# Patient Record
Sex: Female | Born: 1945 | Race: Black or African American | Hispanic: No | Smoking: Never smoker
Health system: Southern US, Community
[De-identification: ages and names within clinical notes are randomized; demographics above are authoritative.]

## PROBLEM LIST (undated history)

## (undated) DIAGNOSIS — I739 Peripheral vascular disease, unspecified: Secondary | ICD-10-CM

## (undated) DIAGNOSIS — C801 Malignant (primary) neoplasm, unspecified: Secondary | ICD-10-CM

## (undated) DIAGNOSIS — I10 Essential (primary) hypertension: Secondary | ICD-10-CM

## (undated) DIAGNOSIS — H409 Unspecified glaucoma: Secondary | ICD-10-CM

## (undated) DIAGNOSIS — E119 Type 2 diabetes mellitus without complications: Secondary | ICD-10-CM

## (undated) DIAGNOSIS — H548 Legal blindness, as defined in USA: Secondary | ICD-10-CM

## (undated) DIAGNOSIS — M1990 Unspecified osteoarthritis, unspecified site: Secondary | ICD-10-CM

## (undated) HISTORY — PX: EYE SURGERY: SHX253

---

## 2014-09-13 ENCOUNTER — Ambulatory Visit: Payer: Self-pay | Admitting: Surgery

## 2014-09-13 NOTE — H&P (Signed)
Kristin Hampton 09/13/2014 2:46 PM Location: [facility] Patient #: [id number] DOB: 26-Jul-1945 Widowed / Language: [name] / Race: Black or African American Female History of Present Illness [name] A. 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 She has moved here from [place] with her daughter. She speaks little english and daught helps translate. No abdominal pain constipation, bllod in her stool. At 30 cm a mass was encountered and biopsy done. Path shows adenocarcinoma. Pt daughter adamant about not telling mother it is cancer. I told the daughter this is not a good idea and she needs to talk with her mother about this.  The patient is a 69 year old female   Other Problems [name], RMA; 09/13/2014 2:47 PM) Arthritis High blood pressure  Past Surgical History [name], RMA; 09/13/2014 2:47 PM) No pertinent past surgical history  Diagnostic Studies History [name], RMA; 09/13/2014 2:47 PM) Colonoscopy within last year  Allergies [name], RMA; 09/13/2014 2:48 PM) No Known Drug Allergies 09/13/2014  Medication History [name], RMA; 09/13/2014 2:49 PM) MetFORMIN HCl (500MG  Tablet, Oral daily) Active. Tylenol Extra Strength (500MG  Tablet, Oral as needed) Active. Lisinopril (10MG  Tablet, Oral daily) Active.  Social History [name], RMA; 09/13/2014 2:47 PM) No alcohol use No caffeine use No drug use Tobacco use Never smoker.  Family History [name], [place]; 09/13/2014 2:47 PM) First Degree Relatives No pertinent family history  Pregnancy / Birth History [name], [place]; 09/13/2014 2:47 PM) Age of menopause 75-50 Gravida 7 Maternal age 92-25 Para 7     Review of Systems [name] RMA; 09/13/2014 2:47 PM) General Not Present- Appetite Loss, Chills,  Fatigue, Fever, Night Sweats, Weight Gain and Weight Loss. Skin Not Present- Change in Wart/Mole, Dryness, Hives, Jaundice, New Lesions, Non-Healing Wounds, Rash and Ulcer. HEENT Present- Visual Disturbances. Not Present- Earache, Hearing Loss, Hoarseness, Nose Bleed, Oral Ulcers, Ringing in the Ears, Seasonal Allergies, Sinus Pain, Sore Throat, Wears glasses/contact lenses and Yellow Eyes. Respiratory Not Present- Bloody sputum, Chronic Cough, Difficulty Breathing, Snoring and Wheezing. Breast Not Present- Breast Mass, Breast Pain, Nipple Discharge and Skin Changes. Cardiovascular Not Present- Chest Pain, Difficulty Breathing Lying Down, Leg Cramps, Palpitations, Rapid Heart Rate, Shortness of Breath and Swelling of Extremities. Gastrointestinal Not Present- Abdominal Pain, Bloating, Bloody Stool, Change in Bowel Habits, Chronic diarrhea, Constipation, Difficulty Swallowing, Excessive gas, Gets full quickly at meals, Hemorrhoids, Indigestion, Nausea, Rectal Pain and Vomiting. Female Genitourinary Not Present- Frequency, Nocturia, Painful Urination, Pelvic Pain and Urgency. Musculoskeletal Present- Joint Pain. Not Present- Back Pain, Joint Stiffness, Muscle Pain, Muscle Weakness and Swelling of Extremities. Neurological Not Present- Decreased Memory, Fainting, Headaches, Numbness, Seizures, Tingling, Tremor, Trouble walking and Weakness. Psychiatric Not Present- Anxiety, Bipolar, Change in Sleep Pattern, Depression, Fearful and Frequent crying. Endocrine Not Present- Cold Intolerance, Excessive Hunger, Hair Changes, Heat Intolerance, Hot flashes and New Diabetes. Hematology Not Present- Easy Bruising, Excessive bleeding, Gland problems, HIV and Persistent Infections.  Vitals ([name] RMA; 09/13/2014 2:49 PM) 09/13/2014 2:49 PM Weight: 142.8 lb Height: 62in Body Surface Area: 1.68 m Body Mass Index: 26.12 kg/m Temp.: 98.79F  Pulse: 96 (Regular)  BP: 156/80 (Sitting, Left Arm,  Standard)     Physical Exam ([name] MD; 09/13/2014 5:19 PM)  General Mental Status-Alert. General Appearance-Consistent with stated age. Hydration-Well hydrated. Voice-Normal.  Head and Neck Head-normocephalic,  atraumatic with no lesions or palpable masses. Trachea-midline. Thyroid Gland Characteristics - normal size and consistency.  Eye Note: partially blind   Chest and Lung Exam Chest and lung exam reveals -quiet, even and easy respiratory effort with no use of accessory muscles and on auscultation, normal breath sounds, no adventitious sounds and normal vocal resonance. Inspection Chest Wall - Normal. Back - normal.  Cardiovascular Cardiovascular examination reveals -normal heart sounds, regular rate and rhythm with no murmurs and normal pedal pulses bilaterally.  Abdomen Palpation/Percussion Abdominal Mass Palpable - Location - Left Lower Quadrant. Size - 3 mm (Height). Shape - Round. Consistency - Hard.  Neurologic Neurologic evaluation reveals -alert and oriented x 3 with no impairment of recent or remote memory. Mental Status-Normal.  Musculoskeletal Normal Exam - Left-Upper Extremity Strength Normal and Lower Extremity Strength Normal. Normal Exam - Right-Upper Extremity Strength Normal and Lower Extremity Strength Normal.    Assessment & Plan ([name] A. 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 Daughter does not want MD to tell her this. I told her that this is not a good way to handle this and she may require more treatment. Daughter voices understanding and will help mother understand.  Risk of partial colon resection include bleeding, infection, leak of anastamosis, death,  colostomy, organ injury, kidney injury, ureter injury, bladder injury, SBO, and need for other surgery. Pt agrees to proceed.  Current Plans Pt Education - [facility] Free Text Education/Instructions: discussed with patient and provided information.

## 2014-11-07 ENCOUNTER — Encounter (HOSPITAL_COMMUNITY)
Admission: RE | Admit: 2014-11-07 | Discharge: 2014-11-07 | Disposition: A | Discharge status: Home / Self Care | Payer: Self-pay | Source: Ambulatory Visit | Attending: Surgery | Admitting: Surgery

## 2014-11-07 ENCOUNTER — Other Ambulatory Visit (HOSPITAL_COMMUNITY): Payer: Self-pay | Admitting: *Deleted

## 2014-11-07 ENCOUNTER — Other Ambulatory Visit: Payer: Self-pay

## 2014-11-07 ENCOUNTER — Encounter (HOSPITAL_COMMUNITY): Payer: Self-pay

## 2014-11-07 DIAGNOSIS — H548 Legal blindness, as defined in USA: Secondary | ICD-10-CM | POA: Insufficient documentation

## 2014-11-07 DIAGNOSIS — Z0183 Encounter for blood typing: Secondary | ICD-10-CM | POA: Insufficient documentation

## 2014-11-07 DIAGNOSIS — R000 Tachycardia, unspecified: Secondary | ICD-10-CM | POA: Insufficient documentation

## 2014-11-07 DIAGNOSIS — Z01812 Encounter for preprocedural laboratory examination: Secondary | ICD-10-CM | POA: Insufficient documentation

## 2014-11-07 DIAGNOSIS — C189 Malignant neoplasm of colon, unspecified: Secondary | ICD-10-CM | POA: Insufficient documentation

## 2014-11-07 DIAGNOSIS — Z01818 Encounter for other preprocedural examination: Secondary | ICD-10-CM | POA: Insufficient documentation

## 2014-11-07 DIAGNOSIS — I10 Essential (primary) hypertension: Secondary | ICD-10-CM | POA: Insufficient documentation

## 2014-11-07 DIAGNOSIS — E119 Type 2 diabetes mellitus without complications: Secondary | ICD-10-CM | POA: Insufficient documentation

## 2014-11-07 HISTORY — DX: Legal blindness, as defined in USA: H54.8

## 2014-11-07 HISTORY — DX: Essential (primary) hypertension: I10

## 2014-11-07 HISTORY — DX: Type 2 diabetes mellitus without complications: E11.9

## 2014-11-07 HISTORY — DX: Unspecified glaucoma: H40.9

## 2014-11-07 HISTORY — DX: Unspecified osteoarthritis, unspecified site: M19.90

## 2014-11-07 HISTORY — DX: Peripheral vascular disease, unspecified: I73.9

## 2014-11-07 HISTORY — DX: Malignant (primary) neoplasm, unspecified: C80.1

## 2014-11-07 LAB — CBC WITH DIFFERENTIAL/PLATELET
BASOS ABS: 0 10*3/uL (ref 0.0–0.1)
Basophils Relative: 1 % (ref 0–1)
Eosinophils Absolute: 0.2 10*3/uL (ref 0.0–0.7)
Eosinophils Relative: 3 % (ref 0–5)
HCT: 33.4 % — ABNORMAL LOW (ref 36.0–46.0)
HEMOGLOBIN: 10.5 g/dL — AB (ref 12.0–15.0)
LYMPHS ABS: 2.4 10*3/uL (ref 0.7–4.0)
LYMPHS PCT: 36 % (ref 12–46)
MCH: 25.7 pg — AB (ref 26.0–34.0)
MCHC: 31.4 g/dL (ref 30.0–36.0)
MCV: 81.9 fL (ref 78.0–100.0)
Monocytes Absolute: 0.7 10*3/uL (ref 0.1–1.0)
Monocytes Relative: 11 % (ref 3–12)
NEUTROS PCT: 49 % (ref 43–77)
Neutro Abs: 3.3 10*3/uL (ref 1.7–7.7)
Platelets: 420 10*3/uL — ABNORMAL HIGH (ref 150–400)
RBC: 4.08 MIL/uL (ref 3.87–5.11)
RDW: 14.5 % (ref 11.5–15.5)
WBC: 6.6 10*3/uL (ref 4.0–10.5)

## 2014-11-07 LAB — COMPREHENSIVE METABOLIC PANEL
ALBUMIN: 3.2 g/dL — AB (ref 3.5–5.0)
ALT: 27 U/L (ref 14–54)
AST: 50 U/L — AB (ref 15–41)
Alkaline Phosphatase: 45 U/L (ref 38–126)
Anion gap: 9 (ref 5–15)
BILIRUBIN TOTAL: 0.5 mg/dL (ref 0.3–1.2)
BUN: 11 mg/dL (ref 6–20)
CHLORIDE: 107 mmol/L (ref 101–111)
CO2: 23 mmol/L (ref 22–32)
CREATININE: 0.82 mg/dL (ref 0.44–1.00)
Calcium: 9.1 mg/dL (ref 8.9–10.3)
GFR calc Af Amer: >60 mL/min (ref >60)
GLUCOSE: 145 mg/dL — AB (ref 65–99)
POTASSIUM: 4.5 mmol/L (ref 3.5–5.1)
Sodium: 139 mmol/L (ref 135–145)
Total Protein: 8.4 g/dL — ABNORMAL HIGH (ref 6.5–8.1)

## 2014-11-07 LAB — ABO/RH: ABO/RH(D): O POS

## 2014-11-07 LAB — GLUCOSE, CAPILLARY: Glucose-Capillary: 65 mg/dL (ref 65–99)

## 2014-11-07 NOTE — Progress Notes (Signed)
Pt signed Interpreter Release form allowing daughter, Lewie Chamber to interpret for her. Pt understands and speaks some [place]. Her tribal language is Igbo.   Pt's PCP is Dr. Jeanie Cooks. She denies any cardiac history, chest pain or sob.  Her CBG was 65 on arrival to PAT. Pt was given OJ and peanut butter crackers. Daughter states pt did not have lunch. Daughter states pt's fasting blood sugars are usually around 102.  Pt is legally blind.

## 2014-11-07 NOTE — Pre-Procedure Instructions (Signed)
Evona Hennington  11/07/2014      Your procedure is scheduled on Wednesday, November 16, 2014 at 8:30 AM.   Report to [facility] Entrance "A" Admitting Office at 6:30 AM.   Call this number if you have problems the morning of surgery: [phone]   Any questions prior to day of surgery, please call [phone] between 8 & 4 PM.   Remember:  Do not eat food or drink liquids after midnight Tuesday, 11/15/14.  Take these medicines the morning of surgery with A SIP OF WATER: Tylenol - if needed              Stop Aspirin and Multivitamins 5 days prior to surgery.  How to Manage Your Diabetes Before Surgery   Why is it important to control my blood sugar before and after surgery?   Improving blood sugar levels before and after surgery helps healing and can limit problems.  A way of improving blood sugar control is eating a healthy diet by:  - Eating less sugar and carbohydrates  - Increasing activity/exercise  - Talk with your doctor about reaching your blood sugar goals  High blood sugars (greater than 180 mg/dL) can raise your risk of infections and slow down your recovery so you will need to focus on controlling your diabetes during the weeks before surgery.  Make sure that the doctor who takes care of your diabetes knows about your planned surgery including the date and location.  How do I manage my blood sugars before surgery?   Check your blood sugar at least 4 times a day, 2 days before surgery to make sure that they are not too high or low.    Check your blood sugar the morning of your surgery when you wake up and every 2 hours until you get to the Short-Stay unit.   Treat a low blood sugar (less than 70 mg/dL) with 1/2 cup of clear juice (cranberry or apple), 4 glucose tablets, OR glucose gel.   Recheck blood sugar in 15 minutes after treatment (to make sure it is greater than 70 mg/dL).  If blood sugar is not greater than 70 mg/dL on re-check, call  [phone] for further instructions.     Report your blood sugar to the Short-Stay nurse when you get to Short-Stay.  References:  [employer], 2007 "How to Manage your Diabetes Before and After Surgery".  What do I do about my diabetes medications?   Do not take oral diabetes medicines (pills) the morning of surgery.   Do not wear jewelry, make-up or nail polish.  Do not wear lotions, powders, or perfumes.  You may wear deodorant.  Do not shave 48 hours prior to surgery.    Do not bring valuables to the hospital.  [facility] is not responsible for any belongings or valuables.  Contacts, dentures or bridgework may not be worn into surgery.  Leave your suitcase in the car.  After surgery it may be brought to your room.  For patients admitted to the hospital, discharge time will be determined by your treatment team.  Special instructions:  [facility] - Preparing for Surgery  Before surgery, you can play an important role.  Because skin is not sterile, your skin needs to be as free of germs as possible.  You can reduce the number of germs on you skin by washing with CHG (chlorahexidine gluconate) soap before surgery.  CHG is an antiseptic cleaner which kills germs  and bonds with the skin to continue killing germs even after washing.  Please DO NOT use if you have an allergy to CHG or antibacterial soaps.  If your skin becomes reddened/irritated stop using the CHG and inform your nurse when you arrive at Short Stay.  Do not shave (including legs and underarms) for at least 48 hours prior to the first CHG shower.  You may shave your face.  Please follow these instructions carefully:   1.  Shower with CHG Soap the night before surgery and the                                morning of Surgery.  2.  If you choose to wash your hair, wash your hair first as usual with your       normal shampoo.  3.  After you shampoo, rinse your hair and body thoroughly to  remove the                      Shampoo.  4.  Use CHG as you would any other liquid soap.  You can apply chg directly       to the skin and wash gently with scrungie or a clean washcloth.  5.  Apply the CHG Soap to your body ONLY FROM THE NECK DOWN.        Do not use on open wounds or open sores.  Avoid contact with your eyes, ears, mouth and genitals (private parts).  Wash genitals (private parts) with your normal soap.  6.  Wash thoroughly, paying special attention to the area where your surgery        will be performed.  7.  Thoroughly rinse your body with warm water from the neck down.  8.  DO NOT shower/wash with your normal soap after using and rinsing off       the CHG Soap.  9.  Pat yourself dry with a clean towel.            10.  Wear clean pajamas.            11.  Place clean sheets on your bed the night of your first shower and do not        sleep with pets.  Day of Surgery  Do not apply any lotions the morning of surgery.  Please wear clean clothes to the hospital.    Please read over the following fact sheets that you were given. Pain Booklet, Coughing and Deep Breathing, Blood Transfusion Information and Surgical Site Infection Prevention

## 2014-11-08 LAB — HEMOGLOBIN A1C
HEMOGLOBIN A1C: 6 % — AB (ref 4.8–5.6)
Mean Plasma Glucose: 126 mg/dL

## 2014-11-08 LAB — CEA: CEA: 3.6 ng/mL (ref 0.0–4.7)

## 2014-11-15 MED ORDER — DEXTROSE 5 % IV SOLN
2.0000 g | INTRAVENOUS | Status: AC
  Administered 2014-11-16: 2 g via INTRAVENOUS
  Filled 2014-11-15: qty 2

## 2014-11-15 MED ORDER — CHLORHEXIDINE GLUCONATE CLOTH 2 % EX PADS: 6.0000 | MEDICATED_PAD | Freq: Once | CUTANEOUS | Status: DC

## 2014-11-16 ENCOUNTER — Inpatient Hospital Stay (HOSPITAL_COMMUNITY): Payer: Self-pay | Admitting: Certified Registered Nurse Anesthetist

## 2014-11-16 ENCOUNTER — Encounter (HOSPITAL_COMMUNITY)
Admission: RE | Disposition: A | Discharge status: Home / Self Care | Payer: Self-pay | Source: Ambulatory Visit | Attending: Surgery

## 2014-11-16 ENCOUNTER — Encounter (HOSPITAL_COMMUNITY): Payer: Self-pay | Admitting: *Deleted

## 2014-11-16 ENCOUNTER — Inpatient Hospital Stay (HOSPITAL_COMMUNITY)
Admission: RE | Admit: 2014-11-16 | Discharge: 2014-11-21 | DRG: 330 | Disposition: A | Discharge status: Home / Self Care | Payer: Self-pay | Source: Ambulatory Visit | Attending: Surgery | Admitting: Surgery

## 2014-11-16 DIAGNOSIS — M1990 Unspecified osteoarthritis, unspecified site: Secondary | ICD-10-CM | POA: Diagnosis present

## 2014-11-16 DIAGNOSIS — D649 Anemia, unspecified: Secondary | ICD-10-CM | POA: Diagnosis present

## 2014-11-16 DIAGNOSIS — R509 Fever, unspecified: Secondary | ICD-10-CM

## 2014-11-16 DIAGNOSIS — R030 Elevated blood-pressure reading, without diagnosis of hypertension: Secondary | ICD-10-CM | POA: Diagnosis present

## 2014-11-16 DIAGNOSIS — C186 Malignant neoplasm of descending colon: Principal | ICD-10-CM | POA: Diagnosis present

## 2014-11-16 DIAGNOSIS — Z23 Encounter for immunization: Secondary | ICD-10-CM

## 2014-11-16 DIAGNOSIS — C187 Malignant neoplasm of sigmoid colon: Secondary | ICD-10-CM | POA: Diagnosis present

## 2014-11-16 DIAGNOSIS — C189 Malignant neoplasm of colon, unspecified: Secondary | ICD-10-CM | POA: Diagnosis present

## 2014-11-16 HISTORY — PX: COLON RESECTION: SHX5231

## 2014-11-16 LAB — CBC
HCT: 27.4 % — ABNORMAL LOW (ref 36.0–46.0)
HEMOGLOBIN: 8.7 g/dL — AB (ref 12.0–15.0)
MCH: 25.6 pg — ABNORMAL LOW (ref 26.0–34.0)
MCHC: 31.8 g/dL (ref 30.0–36.0)
MCV: 80.6 fL (ref 78.0–100.0)
PLATELETS: 429 10*3/uL — AB (ref 150–400)
RBC: 3.4 MIL/uL — AB (ref 3.87–5.11)
RDW: 14.3 % (ref 11.5–15.5)
WBC: 10.4 10*3/uL (ref 4.0–10.5)

## 2014-11-16 LAB — GLUCOSE, CAPILLARY
Glucose-Capillary: 111 mg/dL — ABNORMAL HIGH (ref 65–99)
Glucose-Capillary: 185 mg/dL — ABNORMAL HIGH (ref 65–99)

## 2014-11-16 LAB — CREATININE, SERUM
CREATININE: 0.85 mg/dL (ref 0.44–1.00)
GFR calc Af Amer: >60 mL/min (ref >60)

## 2014-11-16 SURGERY — COLON RESECTION LAPAROSCOPIC
Anesthesia: General | Site: Abdomen

## 2014-11-16 MED ORDER — FENTANYL CITRATE (PF) 250 MCG/5ML IJ SOLN
INTRAMUSCULAR | Status: AC
  Filled 2014-11-16: qty 5

## 2014-11-16 MED ORDER — FENTANYL CITRATE (PF) 100 MCG/2ML IJ SOLN
INTRAMUSCULAR | Status: DC | PRN
  Administered 2014-11-16 (×2): 50 ug via INTRAVENOUS
  Administered 2014-11-16 (×2): 100 ug via INTRAVENOUS
  Administered 2014-11-16: 50 ug via INTRAVENOUS

## 2014-11-16 MED ORDER — ESMOLOL HCL 10 MG/ML IV SOLN
INTRAVENOUS | Status: DC | PRN
  Administered 2014-11-16 (×3): 20 mg via INTRAVENOUS

## 2014-11-16 MED ORDER — MEPERIDINE HCL 25 MG/ML IJ SOLN: 6.2500 mg | INTRAMUSCULAR | Status: DC | PRN

## 2014-11-16 MED ORDER — ALVIMOPAN 12 MG PO CAPS
12.0000 mg | ORAL_CAPSULE | Freq: Once | ORAL | Status: AC
  Administered 2014-11-16: 12 mg via ORAL

## 2014-11-16 MED ORDER — DIPHENHYDRAMINE HCL 12.5 MG/5ML PO ELIX: 12.5000 mg | ORAL_SOLUTION | Freq: Four times a day (QID) | ORAL | Status: DC | PRN

## 2014-11-16 MED ORDER — LIDOCAINE HCL (CARDIAC) 20 MG/ML IV SOLN
INTRAVENOUS | Status: AC
  Filled 2014-11-16: qty 5

## 2014-11-16 MED ORDER — HYDROMORPHONE HCL 1 MG/ML IJ SOLN
1.0000 mg | INTRAMUSCULAR | Status: DC | PRN
  Administered 2014-11-17 – 2014-11-19 (×5): 1 mg via INTRAVENOUS
  Filled 2014-11-16 (×6): qty 1

## 2014-11-16 MED ORDER — GLYCOPYRROLATE 0.2 MG/ML IJ SOLN
INTRAMUSCULAR | Status: DC | PRN
  Administered 2014-11-16: .4 mg via INTRAVENOUS

## 2014-11-16 MED ORDER — PROMETHAZINE HCL 25 MG/ML IJ SOLN: 6.2500 mg | INTRAMUSCULAR | Status: DC | PRN

## 2014-11-16 MED ORDER — DEXTROSE 5 % IV SOLN
2.0000 g | Freq: Two times a day (BID) | INTRAVENOUS | Status: AC
  Administered 2014-11-16: 2 g via INTRAVENOUS
  Filled 2014-11-16: qty 2

## 2014-11-16 MED ORDER — LACTATED RINGERS IV SOLN
INTRAVENOUS | Status: DC | PRN
  Administered 2014-11-16 (×2): via INTRAVENOUS

## 2014-11-16 MED ORDER — LISINOPRIL 10 MG PO TABS
10.0000 mg | ORAL_TABLET | Freq: Every day | ORAL | Status: DC
  Administered 2014-11-17 – 2014-11-20 (×4): 10 mg via ORAL
  Filled 2014-11-16 (×4): qty 1

## 2014-11-16 MED ORDER — 0.9 % SODIUM CHLORIDE (POUR BTL) OPTIME
TOPICAL | Status: DC | PRN
  Administered 2014-11-16 (×4): 1000 mL

## 2014-11-16 MED ORDER — ROCURONIUM BROMIDE 50 MG/5ML IV SOLN
INTRAVENOUS | Status: AC
  Filled 2014-11-16: qty 1

## 2014-11-16 MED ORDER — PROPOFOL 10 MG/ML IV BOLUS
INTRAVENOUS | Status: AC
  Filled 2014-11-16: qty 20

## 2014-11-16 MED ORDER — OXYCODONE-ACETAMINOPHEN 5-325 MG PO TABS: 1.0000 | ORAL_TABLET | ORAL | Status: DC | PRN

## 2014-11-16 MED ORDER — HEMOSTATIC AGENTS (NO CHARGE) OPTIME
TOPICAL | Status: DC | PRN
  Administered 2014-11-16: 1 via TOPICAL

## 2014-11-16 MED ORDER — ACETAMINOPHEN 325 MG PO TABS: 650.0000 mg | ORAL_TABLET | Freq: Four times a day (QID) | ORAL | Status: DC | PRN

## 2014-11-16 MED ORDER — DIPHENHYDRAMINE HCL 50 MG/ML IJ SOLN: 12.5000 mg | Freq: Four times a day (QID) | INTRAMUSCULAR | Status: DC | PRN

## 2014-11-16 MED ORDER — PNEUMOCOCCAL VAC POLYVALENT 25 MCG/0.5ML IJ INJ
0.5000 mL | INJECTION | INTRAMUSCULAR | Status: AC
  Administered 2014-11-17: 0.5 mL via INTRAMUSCULAR
  Filled 2014-11-16: qty 0.5

## 2014-11-16 MED ORDER — ONDANSETRON HCL 4 MG/2ML IJ SOLN
INTRAMUSCULAR | Status: DC | PRN
  Administered 2014-11-16: 4 mg via INTRAVENOUS

## 2014-11-16 MED ORDER — KCL IN DEXTROSE-NACL 20-5-0.9 MEQ/L-%-% IV SOLN
INTRAVENOUS | Status: DC
  Administered 2014-11-16 – 2014-11-20 (×5): via INTRAVENOUS
  Filled 2014-11-16 (×11): qty 1000

## 2014-11-16 MED ORDER — BUPIVACAINE-EPINEPHRINE 0.25% -1:200000 IJ SOLN
INTRAMUSCULAR | Status: DC | PRN
  Administered 2014-11-16: 30 mL

## 2014-11-16 MED ORDER — ONDANSETRON HCL 4 MG PO TABS
4.0000 mg | ORAL_TABLET | Freq: Four times a day (QID) | ORAL | Status: DC | PRN
Start: 2014-11-16 — End: 2014-11-21

## 2014-11-16 MED ORDER — ALVIMOPAN 12 MG PO CAPS
12.0000 mg | ORAL_CAPSULE | Freq: Two times a day (BID) | ORAL | Status: DC
  Administered 2014-11-17 – 2014-11-18 (×3): 12 mg via ORAL
  Filled 2014-11-16 (×3): qty 1

## 2014-11-16 MED ORDER — INFLUENZA VAC SPLIT QUAD 0.5 ML IM SUSY
0.5000 mL | PREFILLED_SYRINGE | INTRAMUSCULAR | Status: AC
  Administered 2014-11-17: 0.5 mL via INTRAMUSCULAR
  Filled 2014-11-16: qty 0.5

## 2014-11-16 MED ORDER — PEG 3350-KCL-NA BICARB-NACL 420 G PO SOLR: 4000.0000 mL | Freq: Once | ORAL | Status: DC

## 2014-11-16 MED ORDER — MIDAZOLAM HCL 2 MG/2ML IJ SOLN
INTRAMUSCULAR | Status: AC
  Filled 2014-11-16: qty 4

## 2014-11-16 MED ORDER — HYDROMORPHONE HCL 1 MG/ML IJ SOLN: 0.2500 mg | INTRAMUSCULAR | Status: DC | PRN

## 2014-11-16 MED ORDER — PHENYLEPHRINE 40 MCG/ML (10ML) SYRINGE FOR IV PUSH (FOR BLOOD PRESSURE SUPPORT)
PREFILLED_SYRINGE | INTRAVENOUS | Status: AC
  Filled 2014-11-16: qty 10

## 2014-11-16 MED ORDER — SODIUM CHLORIDE 0.9 % IR SOLN
Status: DC | PRN
  Administered 2014-11-16: 1000 mL

## 2014-11-16 MED ORDER — BUPIVACAINE-EPINEPHRINE (PF) 0.25% -1:200000 IJ SOLN
INTRAMUSCULAR | Status: AC
  Filled 2014-11-16: qty 30

## 2014-11-16 MED ORDER — NEOMYCIN SULFATE 500 MG PO TABS: 1000.0000 mg | ORAL_TABLET | ORAL | Status: DC

## 2014-11-16 MED ORDER — PROPOFOL 10 MG/ML IV BOLUS
INTRAVENOUS | Status: DC | PRN
  Administered 2014-11-16: 150 mg via INTRAVENOUS

## 2014-11-16 MED ORDER — ALUM & MAG HYDROXIDE-SIMETH 200-200-20 MG/5ML PO SUSP: 30.0000 mL | Freq: Four times a day (QID) | ORAL | Status: DC | PRN

## 2014-11-16 MED ORDER — ERYTHROMYCIN BASE 250 MG PO TABS: 1000.0000 mg | ORAL_TABLET | ORAL | Status: DC

## 2014-11-16 MED ORDER — VECURONIUM BROMIDE 10 MG IV SOLR
INTRAVENOUS | Status: DC | PRN
  Administered 2014-11-16 (×2): 2 mg via INTRAVENOUS

## 2014-11-16 MED ORDER — ALVIMOPAN 12 MG PO CAPS
ORAL_CAPSULE | ORAL | Status: AC
  Administered 2014-11-16: 12 mg via ORAL
  Filled 2014-11-16: qty 1

## 2014-11-16 MED ORDER — NEOSTIGMINE METHYLSULFATE 10 MG/10ML IV SOLN
INTRAVENOUS | Status: DC | PRN
  Administered 2014-11-16: 3 mg via INTRAVENOUS

## 2014-11-16 MED ORDER — ZOLPIDEM TARTRATE 5 MG PO TABS: 5.0000 mg | ORAL_TABLET | Freq: Every evening | ORAL | Status: DC | PRN

## 2014-11-16 MED ORDER — SACCHAROMYCES BOULARDII 250 MG PO CAPS
250.0000 mg | ORAL_CAPSULE | Freq: Two times a day (BID) | ORAL | Status: DC
  Administered 2014-11-16 – 2014-11-20 (×8): 250 mg via ORAL
  Filled 2014-11-16 (×8): qty 1

## 2014-11-16 MED ORDER — PHENYLEPHRINE HCL 10 MG/ML IJ SOLN
INTRAMUSCULAR | Status: DC | PRN
  Administered 2014-11-16 (×5): 80 ug via INTRAVENOUS

## 2014-11-16 MED ORDER — ONDANSETRON HCL 4 MG/2ML IJ SOLN
INTRAMUSCULAR | Status: AC
  Filled 2014-11-16: qty 2

## 2014-11-16 MED ORDER — MIDAZOLAM HCL 5 MG/5ML IJ SOLN
INTRAMUSCULAR | Status: DC | PRN
  Administered 2014-11-16 (×2): 1 mg via INTRAVENOUS

## 2014-11-16 MED ORDER — LIDOCAINE HCL (CARDIAC) 20 MG/ML IV SOLN
INTRAVENOUS | Status: DC | PRN
  Administered 2014-11-16: 60 mg via INTRAVENOUS

## 2014-11-16 MED ORDER — ENOXAPARIN SODIUM 40 MG/0.4ML ~~LOC~~ SOLN
40.0000 mg | SUBCUTANEOUS | Status: DC
  Administered 2014-11-17: 40 mg via SUBCUTANEOUS
  Filled 2014-11-16: qty 0.4

## 2014-11-16 MED ORDER — ONDANSETRON HCL 4 MG/2ML IJ SOLN: 4.0000 mg | Freq: Four times a day (QID) | INTRAMUSCULAR | Status: DC | PRN

## 2014-11-16 SURGICAL SUPPLY — 74 items
APPLIER CLIP 5 13 M/L LIGAMAX5 (MISCELLANEOUS)
BLADE SURG 10 STRL SS (BLADE) ×4 IMPLANT
CANISTER SUCTION 2500CC (MISCELLANEOUS) ×4 IMPLANT
CELLS DAT CNTRL 66122 CELL SVR (MISCELLANEOUS) IMPLANT
CLIP APPLIE 5 13 M/L LIGAMAX5 (MISCELLANEOUS) IMPLANT
COVER MAYO STAND STRL (DRAPES) ×8 IMPLANT
DRAPE PROXIMA HALF (DRAPES) ×4 IMPLANT
DRAPE UTILITY XL STRL (DRAPES) ×4 IMPLANT
DRAPE WARM FLUID 44X44 (DRAPE) ×4 IMPLANT
DRSG OPSITE POSTOP 4X10 (GAUZE/BANDAGES/DRESSINGS) IMPLANT
DRSG OPSITE POSTOP 4X6 (GAUZE/BANDAGES/DRESSINGS) ×4 IMPLANT
DRSG OPSITE POSTOP 4X8 (GAUZE/BANDAGES/DRESSINGS) IMPLANT
DRSG TEGADERM 2-3/8X2-3/4 SM (GAUZE/BANDAGES/DRESSINGS) ×16 IMPLANT
ELECT BLADE 6.5 EXT (BLADE) ×4 IMPLANT
ELECT CAUTERY BLADE 6.4 (BLADE) ×12 IMPLANT
ELECT REM PT RETURN 9FT ADLT (ELECTROSURGICAL) ×4
ELECTRODE REM PT RTRN 9FT ADLT (ELECTROSURGICAL) ×2 IMPLANT
GAUZE SPONGE 2X2 8PLY STRL LF (GAUZE/BANDAGES/DRESSINGS) ×2 IMPLANT
GEL ULTRASOUND 20GR AQUASONIC (MISCELLANEOUS) ×4 IMPLANT
GLOVE BIO SURGEON STRL SZ 6.5 (GLOVE) ×3 IMPLANT
GLOVE BIO SURGEON STRL SZ7 (GLOVE) ×8 IMPLANT
GLOVE BIO SURGEON STRL SZ7.5 (GLOVE) ×8 IMPLANT
GLOVE BIO SURGEON STRL SZ8 (GLOVE) ×8 IMPLANT
GLOVE BIO SURGEONS STRL SZ 6.5 (GLOVE) ×1
GLOVE BIOGEL PI IND STRL 7.0 (GLOVE) ×12 IMPLANT
GLOVE BIOGEL PI IND STRL 8 (GLOVE) ×8 IMPLANT
GLOVE BIOGEL PI INDICATOR 7.0 (GLOVE) ×12
GLOVE BIOGEL PI INDICATOR 8 (GLOVE) ×8
GLOVE ECLIPSE 6.5 STRL STRAW (GLOVE) ×8 IMPLANT
GLOVE SURG SS PI 7.0 STRL IVOR (GLOVE) ×4 IMPLANT
GOWN STRL REUS W/ TWL LRG LVL3 (GOWN DISPOSABLE) ×12 IMPLANT
GOWN STRL REUS W/ TWL XL LVL3 (GOWN DISPOSABLE) ×4 IMPLANT
GOWN STRL REUS W/TWL LRG LVL3 (GOWN DISPOSABLE) ×12
GOWN STRL REUS W/TWL XL LVL3 (GOWN DISPOSABLE) ×4
HEMOSTAT SNOW SURGICEL 2X4 (HEMOSTASIS) ×4 IMPLANT
LEGGING LITHOTOMY PAIR STRL (DRAPES) ×4 IMPLANT
LIGASURE IMPACT 36 18CM CVD LR (INSTRUMENTS) ×4 IMPLANT
NS IRRIG 1000ML POUR BTL (IV SOLUTION) ×16 IMPLANT
PENCIL BUTTON HOLSTER BLD 10FT (ELECTRODE) ×12 IMPLANT
RELOAD PROXIMATE 75MM BLUE (ENDOMECHANICALS) ×8 IMPLANT
RTRCTR WOUND ALEXIS 18CM MED (MISCELLANEOUS)
SCALPEL HARMONIC ACE (MISCELLANEOUS) ×4 IMPLANT
SCISSORS LAP 5X35 DISP (ENDOMECHANICALS) ×4 IMPLANT
SET IRRIG TUBING LAPAROSCOPIC (IRRIGATION / IRRIGATOR) IMPLANT
SLEEVE ENDOPATH XCEL 5M (ENDOMECHANICALS) ×12 IMPLANT
SPONGE GAUZE 2X2 STER 10/PKG (GAUZE/BANDAGES/DRESSINGS) ×2
SPONGE LAP 18X18 X RAY DECT (DISPOSABLE) ×4 IMPLANT
STAPLER GUN LINEAR PROX 60 (STAPLE) ×4 IMPLANT
STAPLER PROXIMATE 75MM BLUE (STAPLE) ×4 IMPLANT
STAPLER VISISTAT 35W (STAPLE) ×4 IMPLANT
SUCTION POOLE TIP (SUCTIONS) ×4 IMPLANT
SUT PDS AB 1 CTX 36 (SUTURE) ×8 IMPLANT
SUT PROLENE 2 0 SH DA (SUTURE) IMPLANT
SUT VIC AB 2-0 SH 18 (SUTURE) ×4 IMPLANT
SUT VIC AB 3-0 SH 18 (SUTURE) ×4 IMPLANT
SUT VICRYL AB 2 0 TIES (SUTURE) ×4 IMPLANT
SUT VICRYL AB 3 0 TIES (SUTURE) ×4 IMPLANT
SYR BULB IRRIGATION 50ML (SYRINGE) ×4 IMPLANT
SYS LAPSCP GELPORT 120MM (MISCELLANEOUS) ×4
SYSTEM LAPSCP GELPORT 120MM (MISCELLANEOUS) ×2 IMPLANT
TOWEL OR 17X26 10 PK STRL BLUE (TOWEL DISPOSABLE) ×8 IMPLANT
TRAY FOLEY CATH 16FRSI W/METER (SET/KITS/TRAYS/PACK) ×4 IMPLANT
TRAY LAPAROSCOPIC MC (CUSTOM PROCEDURE TRAY) ×4 IMPLANT
TRAY PROCTOSCOPIC FIBER OPTIC (SET/KITS/TRAYS/PACK) ×4 IMPLANT
TROCAR XCEL 12X100 BLDLESS (ENDOMECHANICALS) IMPLANT
TROCAR XCEL BLUNT TIP 100MML (ENDOMECHANICALS) IMPLANT
TROCAR XCEL NON-BLD 11X100MML (ENDOMECHANICALS) IMPLANT
TROCAR XCEL NON-BLD 5MMX100MML (ENDOMECHANICALS) ×4 IMPLANT
TUBE CONNECTING 12'X1/4 (SUCTIONS) ×2
TUBE CONNECTING 12X1/4 (SUCTIONS) ×6 IMPLANT
TUBING FILTER THERMOFLATOR (ELECTROSURGICAL) ×4 IMPLANT
TUBING INSUFFLATION (TUBING) ×4 IMPLANT
UNDERPAD 30X30 INCONTINENT (UNDERPADS AND DIAPERS) ×4 IMPLANT
YANKAUER SUCT BULB TIP NO VENT (SUCTIONS) ×8 IMPLANT

## 2014-11-16 NOTE — H&P (Signed)
H&P   Kristin Hampton (MR# [id number])      H&P Info    Author Note Status Last Update User Last Update Date/Time   Erroll Luna, MD Signed Erroll Luna, MD 09/13/2014 5:25 PM    H&P    Expand All Collapse All   Kristin Hampton 09/13/2014 2:46 PM Location: [facility] Patient #: [id number] DOB: 05-28-1945 Widowed / Language: [name] / Race: Black or African American Female History of Present Illness 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 She has moved here from [place] with her daughter. She speaks little english and daught helps translate. No abdominal pain constipation, bllod in her stool. At 30 cm a mass was encountered and biopsy done. Path shows adenocarcinoma. Pt daughter adamant about not telling mother it is cancer. I told the daughter this is not a good idea and she needs to talk with her mother about this.  The patient is a 69 year old female   Other Problems [name], RMA; 09/13/2014 2:47 PM) Arthritis High blood pressure  Past Surgical History [name], RMA; 09/13/2014 2:47 PM) No pertinent past surgical history  Diagnostic Studies History [name], RMA; 09/13/2014 2:47 PM) Colonoscopy within last year  Allergies [name], RMA; 09/13/2014 2:48 PM) No Known Drug Allergies 09/13/2014  Medication History [name], RMA; 09/13/2014 2:49 PM) MetFORMIN HCl (500MG  Tablet, Oral daily) Active. Tylenol Extra Strength (500MG  Tablet, Oral as needed) Active. Lisinopril (10MG  Tablet, Oral daily) Active.  Social History [name], RMA; 09/13/2014 2:47 PM) No alcohol use No caffeine use No drug use Tobacco use Never smoker.  Family History [name], [place]; 09/13/2014 2:47 PM) First Degree Relatives No pertinent family history  Pregnancy /  Birth History [name], [place]; 09/13/2014 2:47 PM) Age of menopause 34-50 Gravida 7 Maternal age 24-25 Para 7     Review of Systems [name] RMA; 09/13/2014 2:47 PM) General Not Present- Appetite Loss, Chills, Fatigue, Fever, Night Sweats, Weight Gain and Weight Loss. Skin Not Present- Change in Wart/Mole, Dryness, Hives, Jaundice, New Lesions, Non-Healing Wounds, Rash and Ulcer. HEENT Present- Visual Disturbances. Not Present- Earache, Hearing Loss, Hoarseness, Nose Bleed, Oral Ulcers, Ringing in the Ears, Seasonal Allergies, Sinus Pain, Sore Throat, Wears glasses/contact lenses and Yellow Eyes. Respiratory Not Present- Bloody sputum, Chronic Cough, Difficulty Breathing, Snoring and Wheezing. Breast Not Present- Breast Mass, Breast Pain, Nipple Discharge and Skin Changes. Cardiovascular Not Present- Chest Pain, Difficulty Breathing Lying Down, Leg Cramps, Palpitations, Rapid Heart Rate, Shortness of Breath and Swelling of Extremities. Gastrointestinal Not Present- Abdominal Pain, Bloating, Bloody Stool, Change in Bowel Habits, Chronic diarrhea, Constipation, Difficulty Swallowing, Excessive gas, Gets full quickly at meals, Hemorrhoids, Indigestion, Nausea, Rectal Pain and Vomiting. Female Genitourinary Not Present- Frequency, Nocturia, Painful Urination, Pelvic Pain and Urgency. Musculoskeletal Present- Joint Pain. Not Present- Back Pain, Joint Stiffness, Muscle Pain, Muscle Weakness and Swelling of Extremities. Neurological Not Present- Decreased Memory, Fainting, Headaches, Numbness, Seizures, Tingling, Tremor, Trouble walking and Weakness. Psychiatric Not Present- Anxiety, Bipolar, Change in Sleep Pattern, Depression, Fearful and Frequent crying. Endocrine Not Present- Cold Intolerance, Excessive Hunger, Hair Changes, Heat Intolerance, Hot flashes and New Diabetes. Hematology Not Present- Easy Bruising, Excessive bleeding, Gland problems, HIV and Persistent Infections.  Vitals  ([name] RMA; 09/13/2014 2:49 PM) 09/13/2014 2:49 PM Weight: 142.8 lb Height: 62in Body Surface  Area: 1.68 m Body Mass Index: 26.12 kg/m Temp.: 98.21F  Pulse: 96 (Regular)  BP: 156/80 (Sitting, Left Arm, Standard)     Physical Exam (Palmira Stickle A. Juwuan Sedita MD; 09/13/2014 5:19 PM)  General Mental Status-Alert. General Appearance-Consistent with stated age. Hydration-Well hydrated. Voice-Normal.  Head and Neck Head-normocephalic, atraumatic with no lesions or palpable masses. Trachea-midline. Thyroid Gland Characteristics - normal size and consistency.  Eye Note: partially blind   Chest and Lung Exam Chest and lung exam reveals -quiet, even and easy respiratory effort with no use of accessory muscles and on auscultation, normal breath sounds, no adventitious sounds and normal vocal resonance. Inspection Chest Wall - Normal. Back - normal.  Cardiovascular Cardiovascular examination reveals -normal heart sounds, regular rate and rhythm with no murmurs and normal pedal pulses bilaterally.  Abdomen Palpation/Percussion Abdominal Mass Palpable - Location - Left Lower Quadrant. Size - 3 mm (Height). Shape - Round. Consistency - Hard.  Neurologic Neurologic evaluation reveals -alert and oriented x 3 with no impairment of recent or remote memory. Mental Status-Normal.  Musculoskeletal Normal Exam - Left-Upper Extremity Strength Normal and Lower Extremity Strength Normal. Normal Exam - Right-Upper Extremity Strength Normal and Lower Extremity Strength Normal.    Assessment & Plan (Keithon Mccoin A. 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 Daughter does not want MD to tell her this. I told her that this is not a  good way to handle this and she may require more treatment. Daughter voices understanding and will help mother understand.  Risk of partial colon resection include bleeding, infection, leak of anastamosis, death, colostomy, organ injury, kidney injury, ureter injury, bladder injury, SBO, and need for other surgery. Pt agrees to proceed.  Current Plans Pt Education - [facility] Free Text Education/Instructions: discussed with patient and provided information.

## 2014-11-16 NOTE — Anesthesia Postprocedure Evaluation (Signed)
Anesthesia Post Note  Patient: Kristin Hampton  Procedure(s) Performed: Procedure(s) (LRB): LAPAROSCOPIC RESECTION OF DISTAL DESCENDING COLON (N/A)  Anesthesia type: General  Patient location: PACU  Post pain: Pain level controlled  Post assessment: Post-op Vital signs reviewed  Last Vitals: BP 108/57 mmHg  Pulse 102  Temp(Src) 36.3 C (Oral)  Resp 16  Ht 5' (1.524 m)  Wt 144 lb 3 oz (65.403 kg)  BMI 28.16 kg/m2  SpO2 100%  Post vital signs: Reviewed  Level of consciousness: sedated  Complications: No apparent anesthesia complications

## 2014-11-16 NOTE — Anesthesia Preprocedure Evaluation (Addendum)
Anesthesia Evaluation  Patient identified by MRN, date of birth, ID band Patient awake    Reviewed: Allergy & Precautions, NPO status , Patient's Chart, lab work & pertinent test results  Airway Mallampati: II  TM Distance: >3 FB Neck ROM: Full    Dental no notable dental hx. (+) Poor Dentition   Pulmonary neg pulmonary ROS,  breath sounds clear to auscultation  Pulmonary exam normal       Cardiovascular hypertension, Pt. on medications + Peripheral Vascular Disease Rhythm:Regular Rate:Tachycardia     Neuro/Psych negative neurological ROS  negative psych ROS   GI/Hepatic negative GI ROS, Neg liver ROS,   Endo/Other  diabetes, Type 2  Renal/GU negative Renal ROS     Musculoskeletal  (+) Arthritis -,   Abdominal   Peds  Hematology negative hematology ROS (+)   Anesthesia Other Findings   Reproductive/Obstetrics negative OB ROS                           Anesthesia Physical Anesthesia Plan  ASA: III  Anesthesia Plan: General   Post-op Pain Management:    Induction: Intravenous  Airway Management Planned: Oral ETT  Additional Equipment:   Intra-op Plan:   Post-operative Plan: Extubation in OR  Informed Consent: I have reviewed the patients History and Physical, chart, labs and discussed the procedure including the risks, benefits and alternatives for the proposed anesthesia with the patient or authorized representative who has indicated his/her understanding and acceptance.   Dental advisory given  Plan Discussed with: CRNA, Anesthesiologist and Surgeon  Anesthesia Plan Comments:        Anesthesia Quick Evaluation

## 2014-11-16 NOTE — Interval H&P Note (Signed)
History and Physical Interval Note:  11/16/2014 7:40 AM  Kristin Hampton  has presented today for surgery, with the diagnosis of Sigmoid Colon Cancer  The various methods of treatment have been discussed with the patient and family. After consideration of risks, benefits and other options for treatment, the patient has consented to  Procedure(s): LAPAROSCOPIC SIGMOID COLECTOMY (N/A) as a surgical intervention .  The patient's history has been reviewed, patient examined, no change in status, stable for surgery.  I have reviewed the patient's chart and labs.  Questions were answered to the patient's satisfaction.     Mcgregor Tinnon A.

## 2014-11-16 NOTE — Anesthesia Procedure Notes (Signed)
Procedure Name: Intubation Date/Time: 11/16/2014 8:36 AM Performed by: Clearnce Sorrel Pre-anesthesia Checklist: Patient identified, Timeout performed, Emergency Drugs available, Suction available and Patient being monitored Patient Re-evaluated:Patient Re-evaluated prior to inductionOxygen Delivery Method: Circle system utilized Preoxygenation: Pre-oxygenation with 100% oxygen Intubation Type: IV induction Ventilation: Mask ventilation without difficulty Laryngoscope Size: Mac and 3 Grade View: Grade II Tube type: Oral Tube size: 7.0 mm Number of attempts: 1 Airway Equipment and Method: Stylet Placement Confirmation: ETT inserted through vocal cords under direct vision,  breath sounds checked- equal and bilateral and positive ETCO2 Secured at: 23 cm Tube secured with: Tape Dental Injury: Teeth and Oropharynx as per pre-operative assessment

## 2014-11-16 NOTE — Transfer of Care (Signed)
Immediate Anesthesia Transfer of Care Note  Patient: Kristin Hampton  Procedure(s) Performed: Procedure(s): LAPAROSCOPIC RESECTION OF DISTAL DESCENDING COLON (N/A)  Patient Location: PACU  Anesthesia Type:General  Level of Consciousness: awake, alert  and oriented  Airway & Oxygen Therapy: Patient Spontanous Breathing and Patient connected to face mask oxygen  Post-op Assessment: Report given to RN and Post -op Vital signs reviewed and stable  Post vital signs: Reviewed and stable  Last Vitals:  Filed Vitals:   11/16/14 0717  BP: 148/73  Pulse: 107  Temp: 36.8 C  Resp: 20    Complications: No apparent anesthesia complications

## 2014-11-16 NOTE — Op Note (Signed)
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  Asst.: Dr. Rosendo Gros  Anesthesia: Gen.  EBL: 50 mL  Specimen: Descending colon to pathology with grossly negative margins  Drains: None  IV fluids: 1200 mL crystalloid  Indications for procedure: Patient presents for partial colectomy secondary to nearly obstructing colon mass found on colonoscopy was felt to be her proximal sigmoid colon. We discussed the procedure at great length with her family. All of her questions are answered to the best of our ability. A translator was used to help her understand. She proceed.The procedure was discussed with the patient.  Laparoscopic partial colectomy discussed with the patient as well as non operative treatments. The risks of operative management include bleeding,  Infection,  Leak of anastamosis,  Ostomy formation, open procedure,  Sepsis,  Abcess,  Hernia,  DVT,  Pulmonary complications,  Cardiovascular  complications,  Injury to ureter,  Bladder,kidney,and anesthesia risks,  And death. The patient understands.  Questions answered.   The success of the procedure is 50-85 % for treating the patients symptoms. They agree to proceed.  Description of procedure: Patient was met in the holding area with her family and all questions were answered for them. She was taken back to the operating room and placed on the OR table. After induction of general endotracheal esthesia, placed lithotomy and appropriately. Abdomen and perineum were prepped and draped in sterile fashion. Void catheter was placed sterilely. Timeout was done to verify proper patient and procedure. A 5 mm trochars placed 5 mm scope the right midabdomen laterally. The abdominal cavity with injury in 18 mmHg of CO2 insufflation achieved. No evidence of trocar injury. 3  additional 5 mm ports are placed one in the right lower quadrant, the right upper quadrant, one in the left upper quadrant. The colon cancer was easily seen this corresponded to the distal descending colon. There were some adhesions to the lateral sidewall this did not appear to be. We mobilized the distance the colon proximal sigmoid colon carefully taking care not to injure the left ureter. The descending colon was mobilized along the white line of [place]. The splenic flexure was completely mobilized without injury to the left kidney or spleen or pancreas. Stomach was avoided as well. Once the flexure came down still felt tethered. A 7 cm incision was made in the lower abdomen midline and a gel hand port was placed. I was able to reach up with my hand help finish mobilizing the splenic flexure without injury to neighboring structures. This gave [place] ample mobility. The specimen was delivered through the [place]. GIA 75 stapler was used to divide the specimen proximal and distal to the tumor. LigaSure was used to take down the mesentery. The anastomosis was above the proximal sigmoid colon. I estimate this to be at approximately 35 cm to 40 cm from the anal verge. A side-to-side functional end anastomosis was created using a GIA-75 stapler device TA 60 to close the common enterotomy. A single stitch was placed across anastomosis. There is no undue tension widely patent. There is some oozing from the staple lines controlled with 3-0 Vicryl in a small piece of Surgicel for that. Of note, he splenic flexure was examined laparoscopically before removal of the specimen this was hemostatic. Irrigation was used and suctioned out. The anastomosis had no tension and laid in the abdominal left lower  quadrant well. Ports removed as well as the wound protector and the: Protocol was initiated. After changing gloves and gowns reprepping the area, the fascia was closed #1 PDS. Skin staples were used to close the skin and all port  sites after irrigating subcutaneous tissues. Honeycomb dressing placed. All final counts the sponge, needles and instrument are found to be correct this portion case. The patient was taken to lithotomy, extubated and taken to recovery in satisfactory condition.

## 2014-11-17 ENCOUNTER — Encounter (HOSPITAL_COMMUNITY): Payer: Self-pay | Admitting: Surgery

## 2014-11-17 LAB — CBC
HEMATOCRIT: 24.7 % — AB (ref 36.0–46.0)
Hemoglobin: 7.8 g/dL — ABNORMAL LOW (ref 12.0–15.0)
MCH: 25.8 pg — ABNORMAL LOW (ref 26.0–34.0)
MCHC: 31.6 g/dL (ref 30.0–36.0)
MCV: 81.8 fL (ref 78.0–100.0)
PLATELETS: 388 10*3/uL (ref 150–400)
RBC: 3.02 MIL/uL — AB (ref 3.87–5.11)
RDW: 14.6 % (ref 11.5–15.5)
WBC: 5 10*3/uL (ref 4.0–10.5)

## 2014-11-17 LAB — BASIC METABOLIC PANEL
Anion gap: 4 — ABNORMAL LOW (ref 5–15)
CHLORIDE: 111 mmol/L (ref 101–111)
CO2: 24 mmol/L (ref 22–32)
Calcium: 7.5 mg/dL — ABNORMAL LOW (ref 8.9–10.3)
Creatinine, Ser: 0.89 mg/dL (ref 0.44–1.00)
Glucose, Bld: 150 mg/dL — ABNORMAL HIGH (ref 65–99)
POTASSIUM: 3.9 mmol/L (ref 3.5–5.1)
SODIUM: 139 mmol/L (ref 135–145)

## 2014-11-17 NOTE — Progress Notes (Signed)
1 Day Post-Op  Subjective: PT DOING WELL NO COMPLAINTS DAUGHTER AT BEDSIDE   Objective: Vital signs in last 24 hours: Temp:  [97.3 F (36.3 C)-98.7 F (37.1 C)] 98.5 F (36.9 C) (09/01 0610) Pulse Rate:  [92-107] 103 (09/01 0610) Resp:  [15-25] 20 (09/01 0610) BP: (108-161)/(57-85) 118/65 mmHg (09/01 0610) SpO2:  [98 %-100 %] 100 % (09/01 0610) FiO2 (%):  [2 %] 2 % (08/31 2222) Last BM Date: 11/15/14  Intake/Output from previous day: 08/31 0701 - 09/01 0700 In: 2870 [I.V.:2870] Out: 1200 [Urine:1050; Blood:150] Intake/Output this shift:    Incision/Wound:CDI SOFT ND   Lab Results:   Recent Labs  11/16/14 1302 11/17/14 0519  WBC 10.4 5.0  HGB 8.7* 7.8*  HCT 27.4* 24.7*  PLT 429* 388   BMET  Recent Labs  11/16/14 1302 11/17/14 0519  NA  --  139  K  --  3.9  CL  --  111  CO2  --  24  GLUCOSE  --  150*  BUN  --  <5*  CREATININE 0.85 0.89  CALCIUM  --  7.5*   PT/INR No results for input(s): LABPROT, INR in the last 72 hours. ABG No results for input(s): PHART, HCO3 in the last 72 hours.  Invalid input(s): PCO2, PO2  Studies/Results: No results found.  Anti-infectives: Anti-infectives    Start     Dose/Rate Route Frequency Ordered Stop   11/16/14 2030  cefoTEtan (CEFOTAN) 2 g in dextrose 5 % 50 mL IVPB     2 g 100 mL/hr over 30 Minutes Intravenous Every 12 hours 11/16/14 1228 11/16/14 2117   11/16/14 0800  cefoTEtan (CEFOTAN) 2 g in dextrose 5 % 50 mL IVPB     2 g 100 mL/hr over 30 Minutes Intravenous To ShortStay Surgical 11/15/14 1202 11/16/14 0838   11/16/14 0707  neomycin (MYCIFRADIN) tablet 1,000 mg  Status:  Discontinued     1,000 mg Oral 3 times per day 11/16/14 0707 11/16/14 0711   11/16/14 0707  erythromycin (E-MYCIN) tablet 1,000 mg  Status:  Discontinued     1,000 mg Oral 3 times per day 11/16/14 0707 11/16/14 0711      Assessment/Plan: s/p Procedure(s): LAPAROSCOPIC RESECTION OF DISTAL DESCENDING COLON (N/A) ANEMIA ACUTE ON  CHRONIC  MONITOR FOR NOW PT NOT DIZZY WHEN OUT OF BED  ADV DIET FOLEY OUT  AMBULATE   LOS: 1 day    Clovia Reine A. 11/17/2014

## 2014-11-18 ENCOUNTER — Inpatient Hospital Stay (HOSPITAL_COMMUNITY): Payer: Self-pay

## 2014-11-18 LAB — CBC
HCT: 23.5 % — ABNORMAL LOW (ref 36.0–46.0)
HEMOGLOBIN: 7.4 g/dL — AB (ref 12.0–15.0)
MCH: 26.1 pg (ref 26.0–34.0)
MCHC: 31.5 g/dL (ref 30.0–36.0)
MCV: 82.7 fL (ref 78.0–100.0)
PLATELETS: 414 10*3/uL — AB (ref 150–400)
RBC: 2.84 MIL/uL — AB (ref 3.87–5.11)
RDW: 14.8 % (ref 11.5–15.5)
WBC: 8.1 10*3/uL (ref 4.0–10.5)

## 2014-11-18 NOTE — Progress Notes (Signed)
2 Days Post-Op  Subjective: No distress says she passed blood with a BM No vomiting   Objective: Vital signs in last 24 hours: Temp:  [98.3 F (36.8 C)-100.1 F (37.8 C)] 99.4 F (37.4 C) (09/02 0520) Pulse Rate:  [95-106] 106 (09/02 0520) Resp:  [20-22] 20 (09/02 0520) BP: (134-148)/(58-73) 134/58 mmHg (09/02 0520) SpO2:  [100 %] 100 % (09/02 0520) Last BM Date: 11/18/14  Intake/Output from previous day: 09/01 0701 - 09/02 0700 In: 1779.8 [P.O.:240; I.V.:1539.8] Out: 350 [Urine:350] Intake/Output this shift:    Incision/Wound:CDI soft ND abdomen   Lab Results:   Recent Labs  11/17/14 0519 11/18/14 0258  WBC 5.0 8.1  HGB 7.8* 7.4*  HCT 24.7* 23.5*  PLT 388 414*   BMET  Recent Labs  11/16/14 1302 11/17/14 0519  NA  --  139  K  --  3.9  CL  --  111  CO2  --  24  GLUCOSE  --  150*  BUN  --  <5*  CREATININE 0.85 0.89  CALCIUM  --  7.5*   PT/INR No results for input(s): LABPROT, INR in the last 72 hours. ABG No results for input(s): PHART, HCO3 in the last 72 hours.  Invalid input(s): PCO2, PO2  Studies/Results: No results found.  Anti-infectives: Anti-infectives    Start     Dose/Rate Route Frequency Ordered Stop   11/16/14 2030  cefoTEtan (CEFOTAN) 2 g in dextrose 5 % 50 mL IVPB     2 g 100 mL/hr over 30 Minutes Intravenous Every 12 hours 11/16/14 1228 11/16/14 2117   11/16/14 0800  cefoTEtan (CEFOTAN) 2 g in dextrose 5 % 50 mL IVPB     2 g 100 mL/hr over 30 Minutes Intravenous To ShortStay Surgical 11/15/14 1202 11/16/14 0838   11/16/14 0707  neomycin (MYCIFRADIN) tablet 1,000 mg  Status:  Discontinued     1,000 mg Oral 3 times per day 11/16/14 0707 11/16/14 0711   11/16/14 0707  erythromycin (E-MYCIN) tablet 1,000 mg  Status:  Discontinued     1,000 mg Oral 3 times per day 11/16/14 0707 11/16/14 0711      Assessment/Plan: s/p Procedure(s): LAPAROSCOPIC RESECTION OF DISTAL DESCENDING COLON (N/A) Anemia and bloody BM hold lovanox for now.   Discussed blood transfusion with her but she refuses currently.  I f her hgb drops below 7 she will need it. hgb is stabilizing at this point. Advance diet Low grade fever probably atelectasis  Check CXR and urine  And encourage incentive spirometry  LOS: 2 days    Kristin Catala A. 11/18/2014

## 2014-11-19 LAB — CBC
HCT: 21.3 % — ABNORMAL LOW (ref 36.0–46.0)
Hemoglobin: 6.7 g/dL — CL (ref 12.0–15.0)
MCH: 25.7 pg — AB (ref 26.0–34.0)
MCHC: 31.5 g/dL (ref 30.0–36.0)
MCV: 81.6 fL (ref 78.0–100.0)
PLATELETS: 368 10*3/uL (ref 150–400)
RBC: 2.61 MIL/uL — ABNORMAL LOW (ref 3.87–5.11)
RDW: 14.7 % (ref 11.5–15.5)
WBC: 7.4 10*3/uL (ref 4.0–10.5)

## 2014-11-19 LAB — PREPARE RBC (CROSSMATCH)

## 2014-11-19 LAB — PROTIME-INR
INR: 1.13 (ref 0.00–1.49)
Prothrombin Time: 14.7 seconds (ref 11.6–15.2)

## 2014-11-19 LAB — APTT: APTT: 31 s (ref 24–37)

## 2014-11-19 MED ORDER — SODIUM CHLORIDE 0.9 % IV SOLN
Freq: Once | INTRAVENOUS | Status: AC
  Administered 2014-11-19: 06:00:00 via INTRAVENOUS

## 2014-11-19 NOTE — Progress Notes (Signed)
Pt's hgb this am is 6.7, informed Dr. Brantley Stage with order to transfuse 2 units of blood, pt says she wants to wait for her daughter before we give the blood, this nurse called the daughter and they said their on their way, Dr. Brantley Stage aware.

## 2014-11-19 NOTE — Progress Notes (Addendum)
3 Days Post-Op  Subjective: Feels ok  Moving bowels tolerating diet low grade fever  Objective: Vital signs in last 24 hours: Temp:  [98.9 F (37.2 C)-99.8 F (37.7 C)] 99.1 F (37.3 C) (09/03 0616) Pulse Rate:  [96-101] 100 (09/03 0616) Resp:  [16-20] 18 (09/03 0616) BP: (130-146)/(61-76) 132/76 mmHg (09/03 0616) SpO2:  [98 %-100 %] 98 % (09/03 0616) Last BM Date: 11/18/14  Intake/Output from previous day: 09/02 0701 - 09/03 0700 In: 960 [P.O.:960] Out: -  Intake/Output this shift:    Incision/Wound:CDI soft abdomen  Sore around her incision no rebound or guarding Lungs CTA CV RRR  Lab Results:   Recent Labs  11/18/14 0258 11/19/14 0259  WBC 8.1 7.4  HGB 7.4* 6.7*  HCT 23.5* 21.3*  PLT 414* 368   BMET  Recent Labs  11/16/14 1302 11/17/14 0519  NA  --  139  K  --  3.9  CL  --  111  CO2  --  24  GLUCOSE  --  150*  BUN  --  <5*  CREATININE 0.85 0.89  CALCIUM  --  7.5*   PT/INR No results for input(s): LABPROT, INR in the last 72 hours. ABG No results for input(s): PHART, HCO3 in the last 72 hours.  Invalid input(s): PCO2, PO2  Studies/Results: Dg Chest Port 1 View  11/18/2014   CLINICAL DATA:  Fever  EXAM: PORTABLE CHEST - 1 VIEW  COMPARISON:  11/07/2014  FINDINGS: Linear opacities in both lung bases are now evident, right greater than left . There may be a small right pleural effusion. No overt edema. Heart size normal for technique. Mildly tortuous thoracic aorta. No pneumothorax. Visualized skeletal structures are unremarkable.  IMPRESSION: 1. Bibasilar subsegmental atelectasis or infiltrates, right greater than left, with possible small right effusion.   Electronically Signed   By: Lucrezia Europe M.D.   On: 11/18/2014 08:58    Anti-infectives: Anti-infectives    Start     Dose/Rate Route Frequency Ordered Stop   11/16/14 2030  cefoTEtan (CEFOTAN) 2 g in dextrose 5 % 50 mL IVPB     2 g 100 mL/hr over 30 Minutes Intravenous Every 12 hours 11/16/14 1228  11/16/14 2117   11/16/14 0800  cefoTEtan (CEFOTAN) 2 g in dextrose 5 % 50 mL IVPB     2 g 100 mL/hr over 30 Minutes Intravenous To ShortStay Surgical 11/15/14 1202 11/16/14 0838   11/16/14 0707  neomycin (MYCIFRADIN) tablet 1,000 mg  Status:  Discontinued     1,000 mg Oral 3 times per day 11/16/14 0707 11/16/14 0711   11/16/14 0707  erythromycin (E-MYCIN) tablet 1,000 mg  Status:  Discontinued     1,000 mg Oral 3 times per day 11/16/14 0707 11/16/14 0711      Assessment/Plan: s/p Procedure(s): LAPAROSCOPIC RESECTION OF DISTAL DESCENDING COLON (N/A) ANEMIA ACUTE ON CHRONIC  Continue to drift slowly down  Will need 2 U PRBC pt wants daughter  But at this point needs blood products Will check coags and hold lovenox for now Advance diet  Decrease IVF CXR and UA ok  LOS: 3 days    CORNETT,THOMAS A. 11/19/2014 Patient and daughter want to hold off on transfusion today.  Daughter brought in a drink from home that the patient believes will help with anemia and she wants to try that first.  Therefore will hold off on transfusion today.  She agreed that if the Hg continues to go down then the patient will be transfused.  Matt B. Hassell Done, MD, [facility] [phone] beeper [phone]  11/19/2014 9:38 AM

## 2014-11-19 NOTE — Progress Notes (Signed)
This Probation officer talked with patient daughter about the blood transfusion, per daughter patient is asking if we could hold on for another day and for [place] to recheck hemoglobin again in am. Md on call made aware.

## 2014-11-20 LAB — CBC WITH DIFFERENTIAL/PLATELET
BAND NEUTROPHILS: 0 % (ref 0–10)
BASOS ABS: 0 10*3/uL (ref 0.0–0.1)
BLASTS: 0 %
Basophils Relative: 0 % (ref 0–1)
EOS ABS: 0.3 10*3/uL (ref 0.0–0.7)
Eosinophils Relative: 4 % (ref 0–5)
HEMATOCRIT: 23 % — AB (ref 36.0–46.0)
Hemoglobin: 7.3 g/dL — ABNORMAL LOW (ref 12.0–15.0)
LYMPHS ABS: 2.2 10*3/uL (ref 0.7–4.0)
Lymphocytes Relative: 25 % (ref 12–46)
MCH: 26.1 pg (ref 26.0–34.0)
MCHC: 31.7 g/dL (ref 30.0–36.0)
MCV: 82.1 fL (ref 78.0–100.0)
METAMYELOCYTES PCT: 0 %
MYELOCYTES: 0 %
Monocytes Absolute: 0.7 10*3/uL (ref 0.1–1.0)
Monocytes Relative: 8 % (ref 3–12)
Neutro Abs: 5.5 10*3/uL (ref 1.7–7.7)
Neutrophils Relative %: 63 % (ref 43–77)
Other: 0 %
PLATELETS: 443 10*3/uL — AB (ref 150–400)
Promyelocytes Absolute: 0 %
RBC: 2.8 MIL/uL — AB (ref 3.87–5.11)
RDW: 15 % (ref 11.5–15.5)
WBC: 8.7 10*3/uL (ref 4.0–10.5)
nRBC: 0 /100 WBC

## 2014-11-20 LAB — GLUCOSE, CAPILLARY: Glucose-Capillary: 157 mg/dL — ABNORMAL HIGH (ref 65–99)

## 2014-11-20 LAB — CBC
HCT: 22.9 % — ABNORMAL LOW (ref 36.0–46.0)
Hemoglobin: 7 g/dL — ABNORMAL LOW (ref 12.0–15.0)
MCH: 24.9 pg — ABNORMAL LOW (ref 26.0–34.0)
MCHC: 30.6 g/dL (ref 30.0–36.0)
MCV: 81.5 fL (ref 78.0–100.0)
Platelets: 328 10*3/uL (ref 150–400)
RBC: 2.81 MIL/uL — ABNORMAL LOW (ref 3.87–5.11)
RDW: 14.6 % (ref 11.5–15.5)
WBC: 6.3 10*3/uL (ref 4.0–10.5)

## 2014-11-20 NOTE — Progress Notes (Signed)
Patient ID: Kristin Hampton, female   DOB: Aug 08, 1945, 69 y.o.   MRN: [id number] [facility] Progress Note:   4 Days Post-Op  Subjective: Mental status is clear;  Sitting up in a chair with a smile on her face Objective: Vital signs in last 24 hours: Temp:  [98.7 F (37.1 C)-100.6 F (38.1 C)] 98.7 F (37.1 C) (09/04 0507) Pulse Rate:  [94-110] 94 (09/04 0507) Resp:  [16-18] 16 (09/04 0507) BP: (142-159)/(59-76) 143/76 mmHg (09/04 0507) SpO2:  [99 %-100 %] 100 % (09/04 0507)  Intake/Output from previous day: 09/03 0701 - 09/04 0700 In: 2380 [P.O.:600; I.V.:1780] Out: -  Intake/Output this shift:    Physical Exam: Work of breathing is not labored.  Eating and passing gas.    Lab Results:  Results for orders placed or performed during the hospital encounter of 11/16/14 (from the past 48 hour(s))  CBC     Status: Abnormal   Collection Time: 11/19/14  2:59 AM  Result Value Ref Range   WBC 7.4 4.0 - 10.5 K/uL   RBC 2.61 (L) 3.87 - 5.11 MIL/uL   Hemoglobin 6.7 (LL) 12.0 - 15.0 g/dL    Comment: REPEATED TO VERIFY CONSISTENT WITH PREVIOUS RESULT CRITICAL RESULT CALLED TO, READ BACK BY AND VERIFIED WITH: MARIA RASING,RN AT 0450 11/19/14. K.PAXTNO    HCT 21.3 (L) 36.0 - 46.0 %   MCV 81.6 78.0 - 100.0 fL   MCH 25.7 (L) 26.0 - 34.0 pg   MCHC 31.5 30.0 - 36.0 g/dL   RDW 14.7 11.5 - 15.5 %   Platelets 368 150 - 400 K/uL  Prepare RBC     Status: None   Collection Time: 11/19/14  5:50 AM  Result Value Ref Range   Order Confirmation ORDER PROCESSED BY BLOOD BANK   Protime-INR     Status: None   Collection Time: 11/19/14 10:55 AM  Result Value Ref Range   Prothrombin Time 14.7 11.6 - 15.2 seconds   INR 1.13 0.00 - 1.49  APTT     Status: None   Collection Time: 11/19/14 10:55 AM  Result Value Ref Range   aPTT 31 24 - 37 seconds  CBC     Status: Abnormal   Collection Time: 11/20/14 12:12 AM  Result Value Ref Range   WBC 6.3 4.0 - 10.5 K/uL   RBC 2.81 (L) 3.87 - 5.11  MIL/uL   Hemoglobin 7.0 (L) 12.0 - 15.0 g/dL   HCT 22.9 (L) 36.0 - 46.0 %   MCV 81.5 78.0 - 100.0 fL   MCH 24.9 (L) 26.0 - 34.0 pg   MCHC 30.6 30.0 - 36.0 g/dL   RDW 14.6 11.5 - 15.5 %   Platelets 328 150 - 400 K/uL    Radiology/Results: Dg Chest Port 1 View  11/18/2014   CLINICAL DATA:  Fever  EXAM: PORTABLE CHEST - 1 VIEW  COMPARISON:  11/07/2014  FINDINGS: Linear opacities in both lung bases are now evident, right greater than left . There may be a small right pleural effusion. No overt edema. Heart size normal for technique. Mildly tortuous thoracic aorta. No pneumothorax. Visualized skeletal structures are unremarkable.  IMPRESSION: 1. Bibasilar subsegmental atelectasis or infiltrates, right greater than left, with possible small right effusion.   Electronically Signed   By: Lucrezia Europe M.D.   On: 11/18/2014 08:58    Anti-infectives: Anti-infectives    Start     Dose/Rate Route Frequency Ordered Stop   11/16/14 2030  cefoTEtan (CEFOTAN) 2  g in dextrose 5 % 50 mL IVPB     2 g 100 mL/hr over 30 Minutes Intravenous Every 12 hours 11/16/14 1228 11/16/14 2117   11/16/14 0800  cefoTEtan (CEFOTAN) 2 g in dextrose 5 % 50 mL IVPB     2 g 100 mL/hr over 30 Minutes Intravenous To ShortStay Surgical 11/15/14 1202 11/16/14 0838   11/16/14 0707  neomycin (MYCIFRADIN) tablet 1,000 mg  Status:  Discontinued     1,000 mg Oral 3 times per day 11/16/14 0707 11/16/14 0711   11/16/14 0707  erythromycin (E-MYCIN) tablet 1,000 mg  Status:  Discontinued     1,000 mg Oral 3 times per day 11/16/14 4287 11/16/14 6811      Assessment/Plan: Problem List: Patient Active Problem List   Diagnosis Date Noted  . Colon cancer 11/16/2014    Hg remains stable at 7;  Family wanting to avoid transfusion if possible;  Encourage PO intake and activity 4 Days Post-Op    LOS: 4 days   Matt B. Hassell Done, MD, [facility] [phone] beeper [phone]  11/20/2014 8:21 AM

## 2014-11-21 LAB — TYPE AND SCREEN
ABO/RH(D): O POS
Antibody Screen: NEGATIVE
UNIT DIVISION: 0
Unit division: 0

## 2014-11-21 MED ORDER — SACCHAROMYCES BOULARDII 250 MG PO CAPS: 250.0000 mg | ORAL_CAPSULE | Freq: Two times a day (BID) | ORAL | Status: AC

## 2014-11-21 MED ORDER — OXYCODONE-ACETAMINOPHEN 5-325 MG PO TABS: 1.0000 | ORAL_TABLET | ORAL | Status: AC | PRN

## 2014-11-21 NOTE — Discharge Summary (Signed)
Physician Discharge Summary  Patient ID: Kristin Hampton MRN: [id number] DOB/AGE: [birth year] 69 y.o.  Admit date: 11/16/2014 Discharge date: 11/21/2014  Admission Diagnoses:colon cancer  Discharge Diagnoses:  Active Problems:   Colon cancer   Discharged Condition: fair  Hospital Course: Pt had and issue with acute on chronic anemia.  Her HGB dropped to 6.7 but family and patient refused blood products.  She was otherwise stable with BM on POD 3  And soft diet was tolerated. Her HGB stablized at 7.3. UOP excellent and wound CDI.  Will place on MVI with Iron.   Consults: None  Significant Diagnostic Studies: labs:  CBC    Component Value Date/Time   WBC 8.7 11/20/2014 0733   RBC 2.80* 11/20/2014 0733   HGB 7.3* 11/20/2014 0733   HCT 23.0* 11/20/2014 0733   PLT 443* 11/20/2014 0733   MCV 82.1 11/20/2014 0733   MCH 26.1 11/20/2014 0733   MCHC 31.7 11/20/2014 0733   RDW 15.0 11/20/2014 0733   LYMPHSABS 2.2 11/20/2014 0733   MONOABS 0.7 11/20/2014 0733   EOSABS 0.3 11/20/2014 0733   BASOSABS 0.0 11/20/2014 0733     Treatments: surgery: lap sigmoid colostomy Path [id number]  Will need oncology referral as outpatient   Discharge Exam: Blood pressure 146/70, pulse 92, temperature 98.4 F (36.9 C), temperature source Oral, resp. rate 21, height 5' (1.524 m), weight 65.403 kg (144 lb 3 oz), SpO2 100 %. Resp: clear to auscultation bilaterally Cardio: regular rate and rhythm, S1, S2 normal, no murmur, click, rub or gallop Incision/Wound:incisions CDI   Disposition: Final discharge disposition not confirmed     Medication List    ASK your doctor about these medications        acetaminophen 500 MG tablet  Commonly known as:  TYLENOL  Take 650 mg by mouth daily as needed (pain).     aspirin EC 81 MG tablet  Take 81 mg by mouth daily as needed (pain).     lisinopril 10 MG tablet  Commonly known as:  PRINIVIL,ZESTRIL  Take 10 mg by mouth daily.     metFORMIN 500 MG tablet   Commonly known as:  GLUCOPHAGE  Take 500 mg by mouth daily with breakfast.     multivitamin with minerals Tabs tablet  Take 1 tablet by mouth daily. Women's 50 Plus         Signed: Miles Borkowski A. 11/21/2014, 6:28 AM

## 2014-11-21 NOTE — Progress Notes (Signed)
5 Days Post-Op  Subjective: Pt without complaint   Objective: Vital signs in last 24 hours: Temp:  [98.4 F (36.9 C)-99.3 F (37.4 C)] 98.4 F (36.9 C) (09/05 0601) Pulse Rate:  [92-99] 92 (09/05 0601) Resp:  [16-21] 21 (09/05 0601) BP: (146-153)/(64-70) 146/70 mmHg (09/05 0601) SpO2:  [100 %] 100 % (09/05 0601) Last BM Date: 11/20/14  Intake/Output from previous day: 09/04 0701 - 09/05 0700 In: 1395 [I.V.:1395] Out: 550 [Urine:550] Intake/Output this shift: Total I/O In: 1395 [I.V.:1395] Out: 550 [Urine:550]  Incision/Wound:CDI soft abdomen ND min sore around the incision  Lab Results:   Recent Labs  11/20/14 0012 11/20/14 0733  WBC 6.3 8.7  HGB 7.0* 7.3*  HCT 22.9* 23.0*  PLT 328 443*   BMET No results for input(s): NA, K, CL, CO2, GLUCOSE, BUN, CREATININE, CALCIUM in the last 72 hours. PT/INR  Recent Labs  11/19/14 1055  LABPROT 14.7  INR 1.13   ABG No results for input(s): PHART, HCO3 in the last 72 hours.  Invalid input(s): PCO2, PO2  Studies/Results: No results found.  Anti-infectives: Anti-infectives    Start     Dose/Rate Route Frequency Ordered Stop   11/16/14 2030  cefoTEtan (CEFOTAN) 2 g in dextrose 5 % 50 mL IVPB     2 g 100 mL/hr over 30 Minutes Intravenous Every 12 hours 11/16/14 1228 11/16/14 2117   11/16/14 0800  cefoTEtan (CEFOTAN) 2 g in dextrose 5 % 50 mL IVPB     2 g 100 mL/hr over 30 Minutes Intravenous To ShortStay Surgical 11/15/14 1202 11/16/14 0838   11/16/14 0707  neomycin (MYCIFRADIN) tablet 1,000 mg  Status:  Discontinued     1,000 mg Oral 3 times per day 11/16/14 0707 11/16/14 0711   11/16/14 0707  erythromycin (E-MYCIN) tablet 1,000 mg  Status:  Discontinued     1,000 mg Oral 3 times per day 11/16/14 0707 11/16/14 0711      Assessment/Plan: s/p Procedure(s): LAPAROSCOPIC RESECTION OF DISTAL DESCENDING COLON (N/A) Discharge  Follow up  1 week   LOS: 5 days    Kristin Hampton A. 11/21/2014

## 2014-11-21 NOTE — Discharge Instructions (Signed)
[facility]      [facility] [phone]  OPEN ABDOMINAL SURGERY: POST OP INSTRUCTIONS  Always review your discharge instruction sheet given to you by the facility where your surgery was performed.  IF YOU HAVE DISABILITY OR FAMILY LEAVE FORMS, YOU MUST BRING THEM TO THE OFFICE FOR PROCESSING.  PLEASE DO NOT GIVE THEM TO YOUR DOCTOR.  1. A prescription for pain medication may be given to you upon discharge.  Take your pain medication as prescribed, if needed.  If narcotic pain medicine is not needed, then you may take acetaminophen (Tylenol) or ibuprofen (Advil) as needed. 2. Take your usually prescribed medications unless otherwise directed. 3. If you need a refill on your pain medication, please contact your pharmacy. They will contact our office to request authorization.  Prescriptions will not be filled after 5pm or on week-ends. 4. You should follow a light diet the first few days after arrival home, such as soup and crackers, pudding, etc.unless your doctor has advised otherwise. A high-fiber, low fat diet can be resumed as tolerated.   Be sure to include lots of fluids daily. Most patients will experience some swelling and bruising on the chest and neck area.  Ice packs will help.  Swelling and bruising can take several days to resolve 5. Most patients will experience some swelling and bruising in the area of the incision. Ice pack will help. Swelling and bruising can take several days to resolve..  6. It is common to experience some constipation if taking pain medication after surgery.  Increasing fluid intake and taking a stool softener will usually help or prevent this problem from occurring.  A mild laxative (Milk of Magnesia or Miralax) should be taken according to package directions if there are no bowel movements after 48 hours. 7.  You may have steri-strips (small skin tapes) in place directly over the incision.  These strips should be left on the skin for 7-10 days.  If your  surgeon used skin glue on the incision, you may shower in 24 hours.  The glue will flake off over the next 2-3 weeks.  Any sutures or staples will be removed at the office during your follow-up visit. You may find that a light gauze bandage over your incision may keep your staples from being rubbed or pulled. You may shower and replace the bandage daily. 8. ACTIVITIES:  You may resume regular (light) daily activities beginning the next day--such as daily self-care, walking, climbing stairs--gradually increasing activities as tolerated.  You may have sexual intercourse when it is comfortable.  Refrain from any heavy lifting or straining until approved by your doctor. a. You may drive when you no longer are taking prescription pain medication, you can comfortably wear a seatbelt, and you can safely maneuver your car and apply brakes b. Return to Work: ___________________________________ 9. You should see your doctor in the office for a follow-up appointment approximately two weeks after your surgery.  Make sure that you call for this appointment within a day or two after you arrive home to insure a convenient appointment time. OTHER INSTRUCTIONS:  _____________________________________________________________ _____________________________________________________________  WHEN TO CALL YOUR DOCTOR: 1. Fever over 101.0 2. Inability to urinate 3. Nausea and/or vomiting 4. Extreme swelling or bruising 5. Continued bleeding from incision. 6. Increased pain, redness, or drainage from the incision. 7. Difficulty swallowing or breathing 8. Muscle cramping or spasms. 9. Numbness or tingling in hands or feet or around lips.  The clinic staff is available to   answer your questions during regular business hours.  Please don't hesitate to call and ask to speak to one of the nurses if you have concerns.  For further questions, please visit [email or web address]   

## 2014-11-28 ENCOUNTER — Encounter (HOSPITAL_COMMUNITY): Payer: Self-pay

## 2016-11-29 IMAGING — CR DG CHEST 2V
2 series · 2 of 2 positions shown · non-contrast
Comparison: None.

CLINICAL DATA: Preop for colon cancer surgery on 11/16/2014.
History of hypertension and diabetes.

EXAM:
CHEST  2 VIEW

[w chest pa]
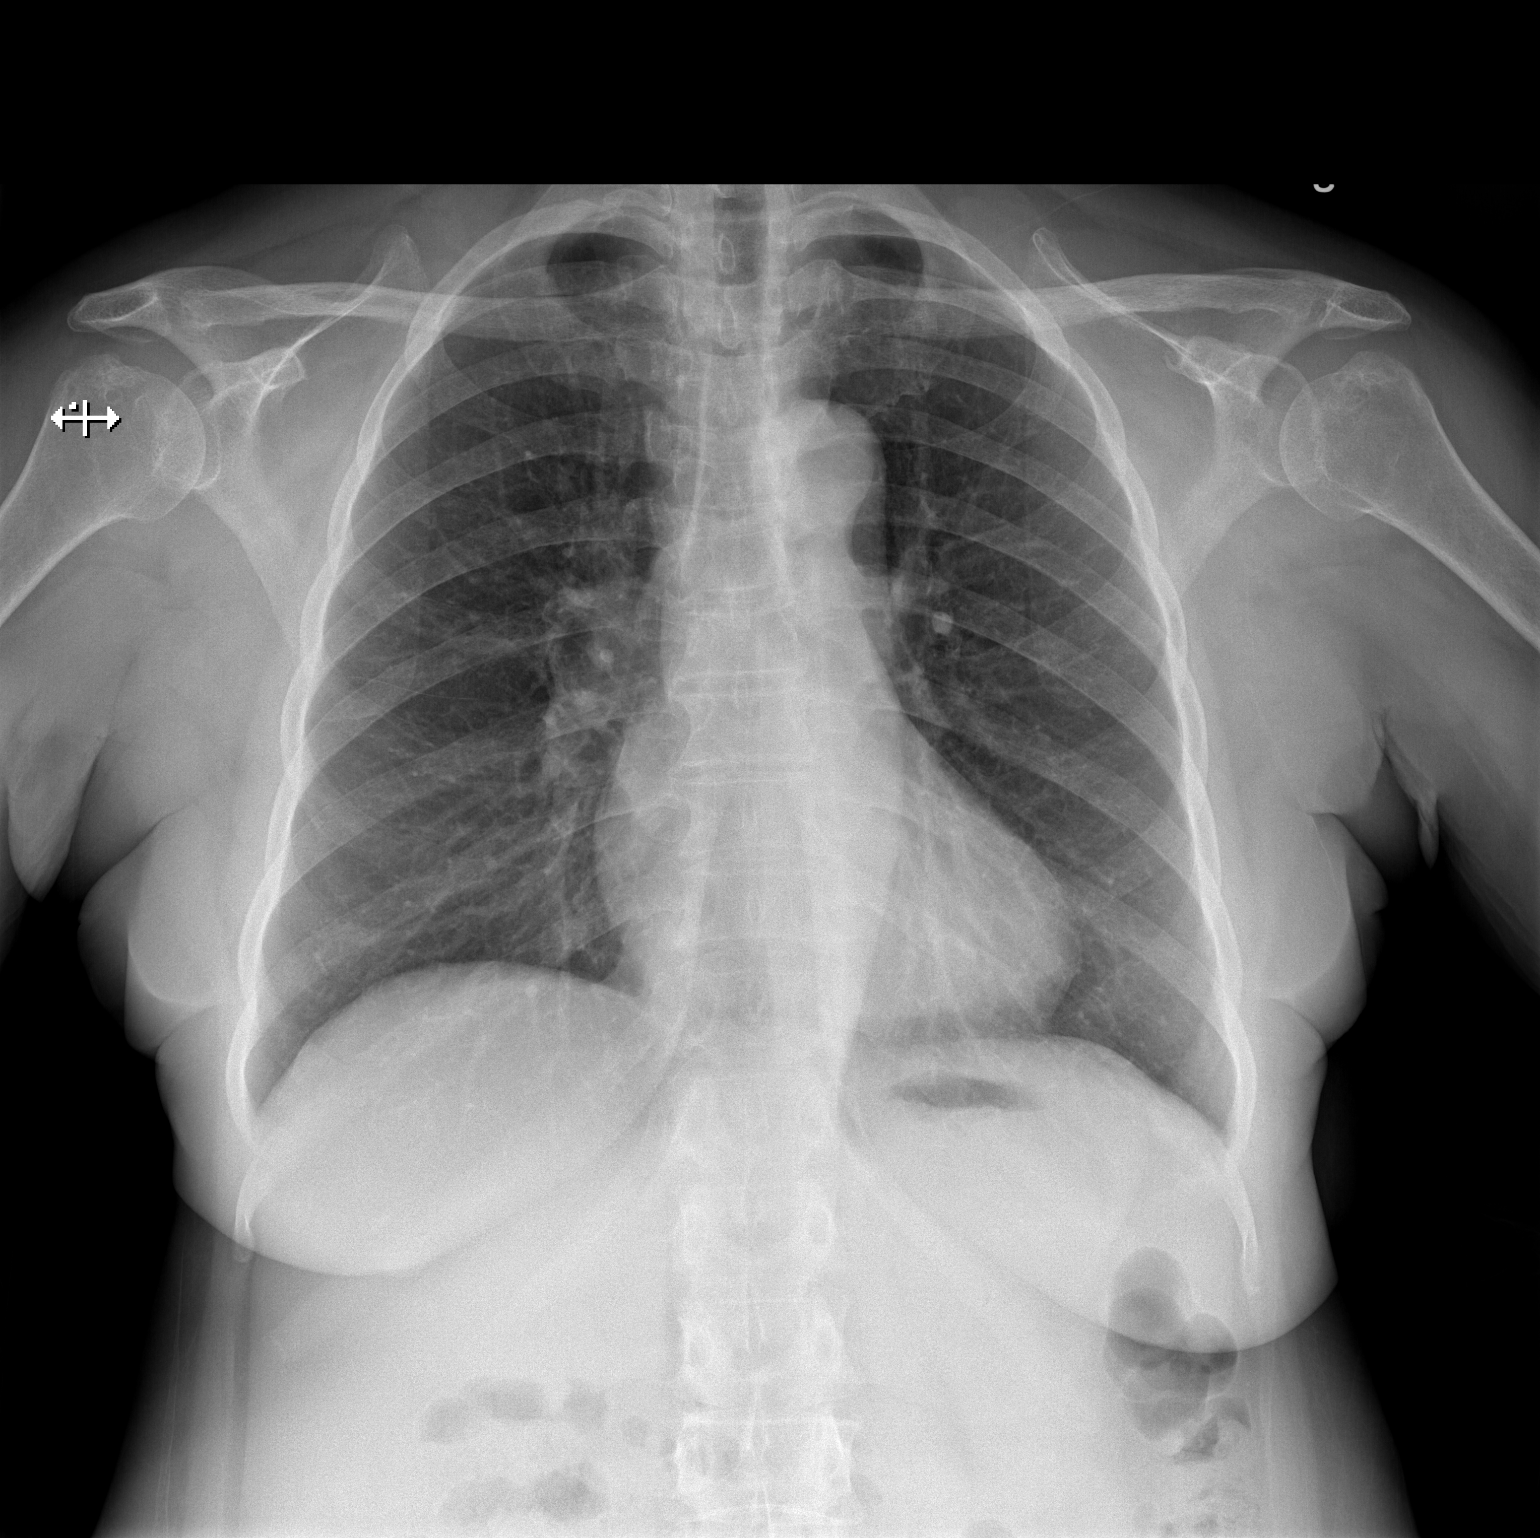

[w chest lat]
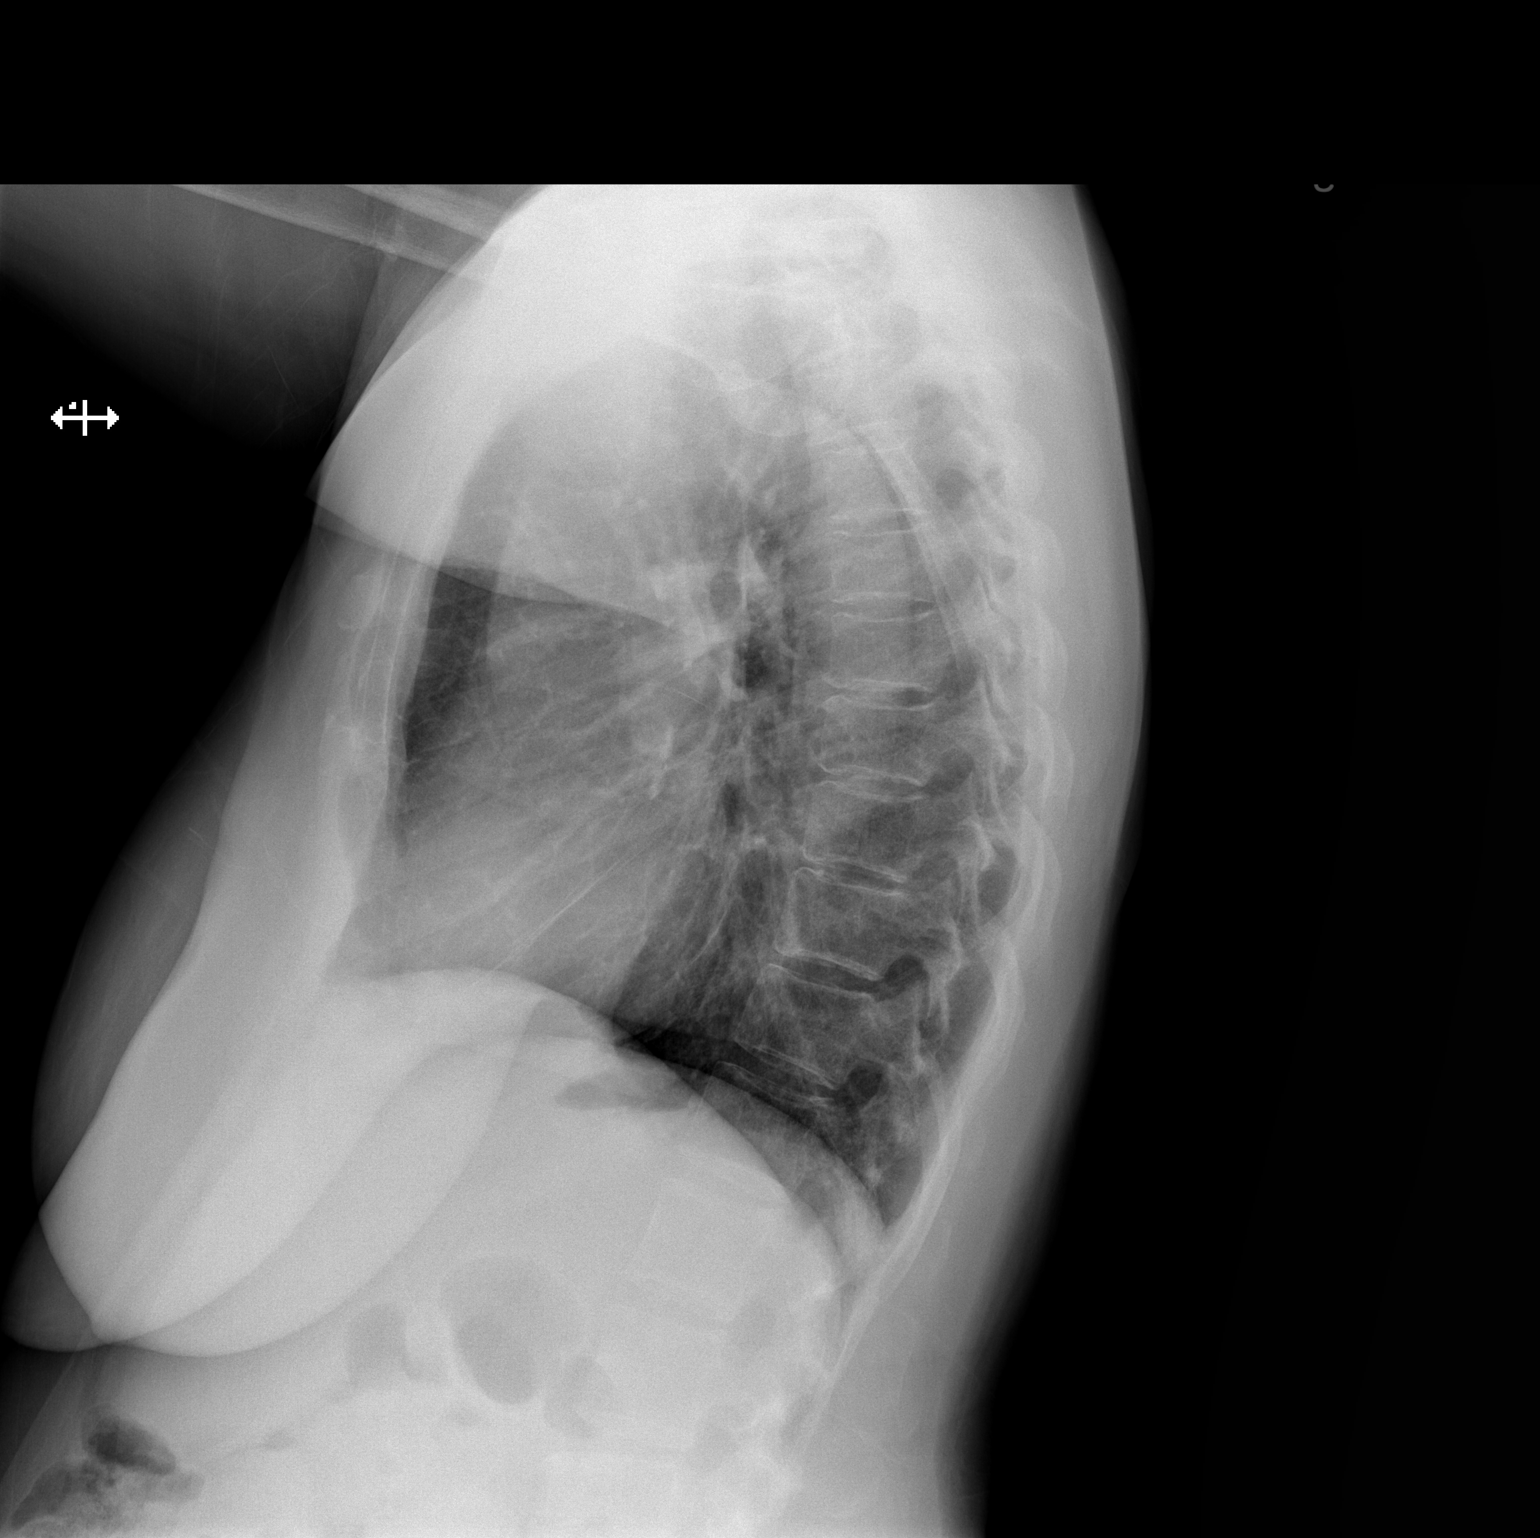

[2 of 2 positions shown; findings below may reference images not displayed]

FINDINGS: The heart size and mediastinal contours are within normal limits.
Both lungs are clear. No pleural effusion or pneumothorax. The
visualized skeletal structures are unremarkable.
IMPRESSION: No active cardiopulmonary disease.
